# Patient Record
Sex: Male | Born: 1975 | Race: Black or African American | Hispanic: No | Marital: Single
Health system: Southern US, Community
[De-identification: ages and names within clinical notes are randomized; demographics above are authoritative.]

## PROBLEM LIST (undated history)

## (undated) ENCOUNTER — Encounter

---

## 2016-02-25 ENCOUNTER — Emergency Department: Admit: 2016-02-25 | Discharge: 2016-02-25

## 2016-02-25 ENCOUNTER — Inpatient Hospital Stay: Admit: 2016-02-25 | Discharge: 2016-02-25

## 2016-02-25 DIAGNOSIS — I10 Essential (primary) hypertension: Secondary | ICD-10-CM

## 2016-02-25 DIAGNOSIS — F17200 Nicotine dependence, unspecified, uncomplicated: Secondary | ICD-10-CM

## 2016-02-25 DIAGNOSIS — R202 Paresthesia of skin: Secondary | ICD-10-CM

## 2016-02-25 DIAGNOSIS — M542 Cervicalgia: Principal | ICD-10-CM

## 2016-02-25 DIAGNOSIS — Z21 Asymptomatic human immunodeficiency virus [HIV] infection status: Secondary | ICD-10-CM

## 2016-02-25 DIAGNOSIS — B20 Human immunodeficiency virus [HIV] disease: Principal | ICD-10-CM

## 2016-02-25 MED ORDER — FENTANYL CITRATE INJ 50 MCG/ML CUSTOM AMP/VIAL
50 ug | INTRAVENOUS | Status: DC | PRN
Start: 2016-02-25 — End: 2016-02-26

## 2016-02-25 MED ORDER — LIDOCAINE 5 % EX PTCH
1 | MEDICATED_PATCH | TRANSDERMAL | 0 refills | Status: CP
Start: 2016-02-25 — End: ?

## 2016-02-25 MED ORDER — FENTANYL CITRATE INJ 50 MCG/ML CUSTOM AMP/VIAL
50 ug | Freq: Once | INTRAVENOUS | Status: CP
Start: 2016-02-25 — End: ?

## 2016-02-25 MED ORDER — NAPROXEN 375 MG PO TABS
500 mg | Freq: Two times a day (BID) | ORAL | 0 refills | Status: CP
Start: 2016-02-25 — End: ?

## 2016-02-25 MED ORDER — AMLODIPINE BESYLATE 5 MG PO TABS
Freq: Every day | ORAL
Start: 2016-02-25 — End: ?

## 2016-02-25 MED ORDER — MIDAZOLAM HCL 2 MG/2ML IJ SOLN
2 mg | Freq: Once | INTRAVENOUS | Status: CP
Start: 2016-02-25 — End: ?

## 2016-02-25 MED ORDER — ACETAMINOPHEN 325 MG PO TABS
650 mg | Freq: Four times a day (QID) | ORAL | 0 refills | Status: CP | PRN
Start: 2016-02-25 — End: ?

## 2016-02-25 NOTE — ED Provider Notes
ED Clinical Impression:   No Clinical Impression Set      ED Patient Status   Patient Status:   {[phone]}        ED Medical Evaluation Initiated   Medical Evaluation Initiated:   Yes, filed at 02/25/16 [id number]  by Deirdre PippinsKumetz, Christopher, MD

## 2016-02-25 NOTE — ED Provider Notes
Yes, filed at 02/25/16 [id number]  by Deirdre PippinsKumetz, Christopher, MD        Charlestine NightAndrea Ochab, DO 3:45 PM 02/25/2016  LT, MC, USN    Family Medicine Residency
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
## 2016-02-25 NOTE — ED Provider Notes
endorse bilateral finger paresthesias    ED Disposition   ED Disposition: No ED Disposition Set      ED Clinical Impression   ED Clinical Impression:   No Clinical Impression Set      ED Patient Status   Patient Status:   Guarded        ED Medical Evaluation Initiated   Medical Evaluation Initiated:   Yes, filed at 02/25/16 [id number]  by Deirdre PippinsKumetz, Christopher, MD
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
## 2016-02-25 NOTE — ED Provider Notes
ED Patient Status   Patient Status:   Fair         ED Medical Evaluation Initiated   Medical Evaluation Initiated:   Yes, filed at 02/25/16 [id number]  by Deirdre PippinsKumetz, Christopher, MD        Charlestine NightAndrea Ochab, DO 3:45 PM 02/25/2016  LT, MC, USN    Family Medicine Residency      Charlestine Nightchab, Andrea, DO  Resident  02/25/16 [phone]
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
## 2016-02-25 NOTE — ED Provider Notes
Genitourinary: Negative for difficulty urinating.   Musculoskeletal: Positive for arthralgias (R sided rib pain). Negative for back pain. Myalgias: R sided rib pain    Skin: Negative for color change.   Neurological: Positive for numbness (numbness in bilateral 2nd digits). Negative for dizziness, tremors, seizures, syncope, facial asymmetry, speech difficulty, weakness, light-headedness and headaches.       Physical Exam       ED Triage Vitals   BP 02/25/16 0905 136/92   Pulse 02/25/16 0905 101   Resp 02/25/16 0905 16   Temp 02/25/16 0905 37.2 ?C (99 ?F)   Temp src 02/25/16 0905 Oral   Height 02/25/16 1021 1.651 m   Weight 02/25/16 1021 77.1 kg   SpO2 02/25/16 0905 99 %   BMI (Calculated) 02/25/16 1021 28.35             Physical Exam   Constitutional: He is oriented to person, place, and time. He appears well-developed and well-nourished. No distress.   HENT:   Head: Normocephalic and atraumatic.   Pt immobilized in c collar    Eyes: Pupils are equal, round, and reactive to light. Left eye exhibits no discharge.   Neck:   Neck immobilized in cspine collar    Cardiovascular: Normal rate, regular rhythm, normal heart sounds and intact distal pulses.    Pulmonary/Chest: Effort normal and breath sounds normal. No respiratory distress. He has no wheezes. He has no rales. He exhibits no tenderness.   Abdominal: Soft. Bowel sounds are normal. He exhibits no distension and no mass. There is no tenderness. There is no rebound and no guarding.   Musculoskeletal: Normal range of motion. He exhibits no edema, tenderness or deformity.   Neurological: He is alert and oriented to person, place, and time. Coordination abnormal.   Skin: Skin is warm and dry. No rash noted. He is not diaphoretic. No erythema.   Psychiatric: He has a normal mood and affect.   Nursing note and vitals reviewed.      Differential DDx: cspine fracture, PTX, clavicular fracture, ICH, PTX
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
## 2016-02-25 NOTE — Consults
Please see trauma H&P.
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
## 2016-02-25 NOTE — ED Provider Notes
Is this an Emergent Medical Condition? {SH ED EMERGENT MEDICAL CONDITION:[id number]}  409.901 FS  641.19 FS  627.732 (16) FS    ED Workup   Procedures    Labs:  - - No data to display      Imaging (Read by ED Provider):  {Imaging findings:[phone]}    EKG (Read by ED Provider):  {EKG findings:[phone]}    MDM    ED Course & Re-Evaluation     ED Course         ED Disposition   ED Disposition: No ED Disposition Set      ED Clinical Impression   ED Clinical Impression:   No Clinical Impression Set      ED Patient Status   Patient Status:   [facility]:[phone]}        ED Medical Evaluation Initiated   Medical Evaluation Initiated:   Yes, filed at 02/25/16 [id number]  by Deirdre PippinsKumetz, Christopher, MD
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
## 2016-02-25 NOTE — ED Provider Notes
Is this an Emergent Medical Condition? Yes - Severe Pain/Acute Onset of Symptons  409.901 FS  641.19 FS  627.732 (16) FS    ED Workup   Procedures    Labs:  - - No data to display      Imaging (Read by ED Provider):  {Imaging findings:[phone]}    Per Radiology: Xr Chest Single View    Result Date: 02/25/2016  Procedure:  XR CHEST SINGLE VIEW Ordering History:  40 years  Male  Chest trauma Comparison: None Findings: There is normal appearance of the cardiomediastinal silhouette. No focal consolidations or pleural effusions are identified. Examination is negative for pneumothorax or acute osseous abnormalities.     Impression: No evidence of acute cardiopulmonary disease.      I personally reviewed the images and the residents findings and agree with the above. Read By Vic Ripper- Joseph Utz M.D.  Electronically Verified By - Vic RipperJoseph Utz M.D.  Released Date Time - 02/25/2016 9:31 AM  Resident - Barrie LymeFranz Toro-Pape     Per Radiology: Xr Pelvis 1 Or 2 Views    Result Date: 02/25/2016  A single AP view of the pelvis was obtained without prior studies for comparison.  No fractures  or dislocations are seen.  No radiopaque foreign bodies are seen.  The visualized soft tissues  are unremarkable.      Impression: No abnormality of the pelvis. Read By Florencia Reasons- Kristin Taylor M.D.  Electronically Verified By - Florencia ReasonsKristin Taylor M.D.  Released Date Time - 02/25/2016 9:36 AM  Resident -     EKG (Read by ED Provider):  {EKG findings:[phone]}  N/A    MDM  Number of Diagnoses or Management Options     Amount and/or Complexity of Data Reviewed  Clinical lab tests: ordered  Tests in the radiology section of CPT?: ordered  Tests in the medicine section of CPT?: ordered  Discussion of test results with the performing providers: yes  Decide to obtain previous medical records or to obtain history from someone other than the patient: no  Obtain history from someone other than the patient: no  Review and summarize past medical records: yes

## 2016-02-25 NOTE — ED Notes
Patient to MRI.

## 2016-02-25 NOTE — ED Notes
Patient reports an improvement in tingling sensation to bilateral hands.
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
## 2016-02-25 NOTE — ED Provider Notes
endorse bilateral finger paresthesias so MRI cspine ordered.     MRI ordered by trauma. Pain controlled. Awaiting reads.        ED Disposition   ED Disposition: No ED Disposition Set      ED Clinical Impression   ED Clinical Impression:   No Clinical Impression Set      ED Patient Status   Patient Status:   Guarded        ED Medical Evaluation Initiated   Medical Evaluation Initiated:   Yes, filed at 02/25/16 [id number]  by Deirdre PippinsKumetz, Christopher, MD
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
## 2016-02-25 NOTE — ED Provider Notes
unremarkable. Craniocervical junction is congruent. C1 dens interval is maintained. The visualized spinal cord has a normal conformation and appropriate internal signal. 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 No focal signal abnormality  is evident to suggest myelomalacia, syrinx or intra axial mass. Soft tissues the neck are grossly unremarkable. C2-3:Unremarkable. C3-4: Posterior disc osteophyte complex causes moderate narrowing of the left neural foramina mild narrowing of the right neural foramina and contributing to mild narrowing of the spinal canal. C4-5: Disc bulge and uncovertebral hypertrophy contribute to mild narrowing of the right neural  foramina. Left neural foramina is patent. C5-6: Broad-based is bulge causes mild narrowing of the bilateral foramina and mild narrowing of the spinal canal.   C6-7: Asymmetric right disc bulge causes mild narrowing of the right neural foramina left neural foramina and spinal canal are patent. C7-T1: Unremarkable.     1.  Mild multilevel spondylosis 2. 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 3. No abnormal cord signal or evidence of acute cord injury.     Per Radiology: Xr Chest Single View    Result Date: 02/25/2016  Procedure:  XR CHEST SINGLE VIEW Ordering History:  40 years  Male  Chest trauma Comparison: None Findings: There is normal appearance of the cardiomediastinal silhouette. No focal consolidations or pleural effusions are identified. Examination is negative for pneumothorax or acute osseous abnormalities.     Impression: No evidence of acute cardiopulmonary disease.      I personally reviewed the images and the residents findings and agree with the above. Read By Vic Ripper- Joseph Utz M.D.  Electronically Verified By - Vic RipperJoseph Utz M.D.  Released Date Time - 02/25/2016 9:31 AM  Resident - Barrie LymeFranz Toro-Pape     Per Radiology: Xr Pelvis 1 Or 2 Views    Result Date: 02/25/2016

## 2016-02-25 NOTE — ED Provider Notes
HENT: Negative for facial swelling, trouble swallowing, neck pain and neck stiffness.    Gastrointestinal: Negative for nausea and vomiting.   Genitourinary: Negative for difficulty urinating.   Musculoskeletal: Positive for arthralgias (R sided rib pain). Negative for back pain. Myalgias: R sided rib pain    Skin: Negative for color change.   Neurological: Positive for numbness (numbness in bilateral 2nd digits). Negative for dizziness, tremors, seizures, syncope, facial asymmetry, speech difficulty, weakness, light-headedness and headaches.       Physical Exam       ED Triage Vitals   BP 02/25/16 0905 136/92   Pulse 02/25/16 0905 101   Resp 02/25/16 0905 16   Temp 02/25/16 0905 37.2 ?C (99 ?F)   Temp src 02/25/16 0905 Oral   Height 02/25/16 1021 1.651 m   Weight 02/25/16 1021 77.1 kg   SpO2 02/25/16 0905 99 %   BMI (Calculated) 02/25/16 1021 28.35             Physical Exam   Constitutional: He is oriented to person, place, and time. He appears well-developed and well-nourished. No distress.   HENT:   Head: Normocephalic and atraumatic.   Pt immobilized in c collar    Eyes: Pupils are equal, round, and reactive to light. Left eye exhibits no discharge.   Neck:   Neck immobilized in cspine collar    Cardiovascular: Normal rate, regular rhythm, normal heart sounds and intact distal pulses.    Pulmonary/Chest: Effort normal and breath sounds normal. No respiratory distress. He has no wheezes. He has no rales. He exhibits no tenderness.   Abdominal: Soft. Bowel sounds are normal. He exhibits no distension and no mass. There is no tenderness. There is no rebound and no guarding.   Musculoskeletal: Normal range of motion. He exhibits no edema, tenderness or deformity.   Neurological: He is alert and oriented to person, place, and time. Coordination abnormal.   Skin: Skin is warm and dry. No rash noted. He is not diaphoretic. No erythema.   Psychiatric: He has a normal mood and affect.   Nursing note and vitals reviewed.
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
## 2016-02-25 NOTE — ED Provider Notes
History   No chief complaint on file.      HPI    No Known Allergies    Patient's Medications    No medications on file       Past Medical History:   Diagnosis Date   ? HIV (human immunodeficiency virus infection)    ? Hypertension        Past Surgical History:   Procedure Laterality Date   ? INCISION & DRAINAGE PILONIDAL CYST / SINUS         History reviewed. No pertinent family history.    Social History     Social History   ? Marital status: N/A     Spouse name: N/A   ? Number of children: N/A   ? Years of education: N/A     Social History Main Topics   ? Smoking status: Current Every Day Smoker   ? Smokeless tobacco: Never Used   ? Alcohol use No   ? Drug use: No   ? Sexual activity: Not Asked     Other Topics Concern   ? None     Social History Narrative   ? None       Review of Systems    Physical Exam     ED Triage Vitals   BP --    Pulse --    Resp --    Temp --    Temp src --    Height --    Weight --    SpO2 --    BMI (Calculated) --              Physical Exam    Differential DDx: ***    Is this an Emergent Medical Condition? {SH ED EMERGENT MEDICAL [phone]}  409.901 FS  641.19 FS  627.732 (16) FS    ED Workup   Procedures    Labs:  - - No data to display      Imaging (Read by ED Provider):  {Imaging findings:[id number]}    EKG (Read by ED Provider):  {EKG findings:[phone]}    MDM    ED Course & Re-Evaluation     ED Course         ED Disposition   ED Disposition: No ED Disposition Set      ED Clinical Impression   ED Clinical Impression:   No Clinical Impression Set      ED Patient Status   Patient Status:   [phone]}        ED Medical Evaluation Initiated   Medical Evaluation Initiated:   Yes, filed at 02/25/16 [id number]  by Deirdre PippinsKumetz, Christopher, MD

## 2016-02-25 NOTE — ED Provider Notes
is no evidence of epidural collection. Posterior fossa structures are unremarkable. Craniocervical junction is congruent. C1 dens interval is maintained. The visualized spinal cord has a normal conformation and appropriate internal signal. 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 No focal signal abnormality  is evident to suggest myelomalacia, syrinx or intra axial mass. Soft tissues the neck are grossly unremarkable. C2-3:Unremarkable. C3-4: Posterior disc osteophyte complex causes moderate narrowing of the left neural foramina mild narrowing of the right neural foramina and contributing to mild narrowing of the spinal canal. C4-5: Disc bulge and uncovertebral hypertrophy contribute to mild narrowing of the right neural  foramina. Left neural foramina is patent. C5-6: Broad-based is bulge causes mild narrowing of the bilateral foramina and mild narrowing of the spinal canal.   C6-7: Asymmetric right disc bulge causes mild narrowing of the right neural foramina left neural foramina and spinal canal are patent. C7-T1: Unremarkable.     1.  Mild multilevel spondylosis 2. 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 3. No abnormal cord signal or evidence of acute cord injury.     Per Radiology: Xr Chest Single View    Result Date: 02/25/2016  Procedure:  XR CHEST SINGLE VIEW Ordering History:  40 years  Male  Chest trauma Comparison: None Findings: There is normal appearance of the cardiomediastinal silhouette. No focal consolidations or pleural effusions are identified. Examination is negative for pneumothorax or acute osseous abnormalities.     Impression: No evidence of acute cardiopulmonary disease.      I personally reviewed the images and the residents findings and agree with the above. Read By Vic Ripper- Joseph Utz M.D.  Electronically Verified By - Vic RipperJoseph Utz M.D.  Released Date Time - 02/25/2016 9:31 AM  Resident - Barrie LymeFranz Toro-Pape
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
## 2016-02-25 NOTE — Progress Notes
I met pt's brother, Ann Maki [phone] and his cousin and escorted them to [facility].  I made staff aware they are here.

## 2016-02-25 NOTE — ED Notes
Patient to CT scan.
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
## 2016-02-25 NOTE — ED Notes
Patient returned to trauma bed 5 from CT scan. Patient complains of neck pain. Patient reports a return to normal sensation to bilateral hands. Family at bedside. Continue to monitor.
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
## 2016-02-25 NOTE — ED Provider Notes
Discuss the patient with other providers: yes  Independent visualization of images, tracings, or specimens: yes    Risk of Complications, Morbidity, and/or Mortality  Presenting problems: high  Diagnostic procedures: high  Management options: high        ED Course & Re-Evaluation     ED Course         ED Disposition   ED Disposition: No ED Disposition Set      ED Clinical Impression   ED Clinical Impression:   No Clinical Impression Set      ED Patient Status   Patient Status:   Guarded        ED Medical Evaluation Initiated   Medical Evaluation Initiated:   Yes, filed at 02/25/16 [id number]  by Deirdre PippinsKumetz, Christopher, MD
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
## 2016-02-25 NOTE — ED Notes
Up OOB ambulatory to trauma waiting room.  Urinated without difficulty per report.  Tolerating PO intake without any difficult.  Discharge pending.
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
## 2019-01-31 ENCOUNTER — Emergency Department (HOSPITAL_COMMUNITY): Payer: Worker's Compensation

## 2019-01-31 ENCOUNTER — Other Ambulatory Visit: Payer: Self-pay

## 2019-01-31 ENCOUNTER — Encounter (HOSPITAL_COMMUNITY): Payer: Self-pay

## 2019-01-31 ENCOUNTER — Emergency Department (HOSPITAL_COMMUNITY)
Admission: EM | Admit: 2019-01-31 | Discharge: 2019-01-31 | Disposition: A | Discharge status: Home / Self Care | Payer: Worker's Compensation | Attending: Emergency Medicine | Admitting: Emergency Medicine

## 2019-01-31 DIAGNOSIS — Y9389 Activity, other specified: Secondary | ICD-10-CM | POA: Diagnosis not present

## 2019-01-31 DIAGNOSIS — S29019A Strain of muscle and tendon of unspecified wall of thorax, initial encounter: Secondary | ICD-10-CM

## 2019-01-31 DIAGNOSIS — Y990 Civilian activity done for income or pay: Secondary | ICD-10-CM | POA: Insufficient documentation

## 2019-01-31 DIAGNOSIS — S20211A Contusion of right front wall of thorax, initial encounter: Secondary | ICD-10-CM | POA: Diagnosis not present

## 2019-01-31 DIAGNOSIS — W1789XA Other fall from one level to another, initial encounter: Secondary | ICD-10-CM

## 2019-01-31 DIAGNOSIS — S299XXA Unspecified injury of thorax, initial encounter: Secondary | ICD-10-CM | POA: Diagnosis present

## 2019-01-31 DIAGNOSIS — Y92812 Truck as the place of occurrence of the external cause: Secondary | ICD-10-CM | POA: Insufficient documentation

## 2019-01-31 DIAGNOSIS — S39012A Strain of muscle, fascia and tendon of lower back, initial encounter: Secondary | ICD-10-CM | POA: Diagnosis not present

## 2019-01-31 DIAGNOSIS — S8002XA Contusion of left knee, initial encounter: Secondary | ICD-10-CM | POA: Diagnosis not present

## 2019-01-31 LAB — RAPID URINE DRUG SCREEN, HOSP PERFORMED
Amphetamines: NOT DETECTED
Barbiturates: NOT DETECTED
Benzodiazepines: NOT DETECTED
Cocaine: NOT DETECTED
Opiates: NOT DETECTED
Tetrahydrocannabinol: NOT DETECTED

## 2019-01-31 MED ORDER — METHOCARBAMOL 750 MG PO TABS: 750.0000 mg | ORAL_TABLET | Freq: Three times a day (TID) | ORAL | 0 refills | Status: AC | PRN

## 2019-01-31 MED ORDER — TRAMADOL HCL 50 MG PO TABS
50.0000 mg | ORAL_TABLET | Freq: Once | ORAL | Status: AC
  Administered 2019-01-31: 08:00:00 50 mg via ORAL
  Filled 2019-01-31: qty 1

## 2019-01-31 NOTE — Discharge Instructions (Addendum)
It was our pleasure to provide your ER care today - we hope that you feel better.  Your xrays were read as showing no fracture/no broken bones.   As we discussed, your left knee pain could be related to ligamentous/soft tissue injury or sprain. Follow up with orthopedist in the next 1-2 weeks - call office to arrange appointment. Wear knee immobilizer/use crutches.   Take ibuprofen or naprosyn as need.  Take robaxin as need for muscle pain/spasm  - no driving when taking.   Return to ER if worse, new symptoms, new or severe pain, severe headache, abdominal pain, numbness/weakness, or other concern.   You were given pain medication in the ER - no driving for the next 6 hours.

## 2019-01-31 NOTE — ED Triage Notes (Signed)
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 Pt denies hitting his head, however, he cannot remember how he landed, also states that he landed on concrete. Pain is in his Lt rib area, lower back & a sharp pain he describes as a "tearing" pain under his Lt knee. Upon arrival to ED pt is A/Ox4, c-collar in place, BP 159/103 & all other VSS.
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
## 2019-01-31 NOTE — Progress Notes (Signed)
Orthopedic Tech Progress Note Patient Details:  Brent Barnes 1975/10/12 [id number]  Ortho Devices Type of Ortho Device: Knee Immobilizer, Crutches Ortho Device/Splint Location: left Ortho Device/Splint Interventions: Application   Post Interventions Patient Tolerated: Well Instructions Provided: Care of device   [name] 01/31/2019, 11:41 AM

## 2019-01-31 NOTE — ED Notes (Signed)
Attempted to assist pt to ambulate and pt stated he could not walk.

## 2019-01-31 NOTE — ED Notes (Signed)
Ortho has been contacted  Is coming to apply immobilizer & bring the correct size crutches that was not found on the unit.

## 2019-01-31 NOTE — ED Notes (Signed)
Pt verbalized understanding of discharge paperwork, prescription and follow-up care.  

## 2020-03-03 IMAGING — DX DG LUMBAR SPINE COMPLETE 4+V
5 series · 5 of 5 positions shown · non-contrast
Comparison: None.

CLINICAL DATA: Pain following fall

EXAM:
LUMBAR SPINE - COMPLETE 4+ VIEW

[t lumbar spine obl (1 of 2)]
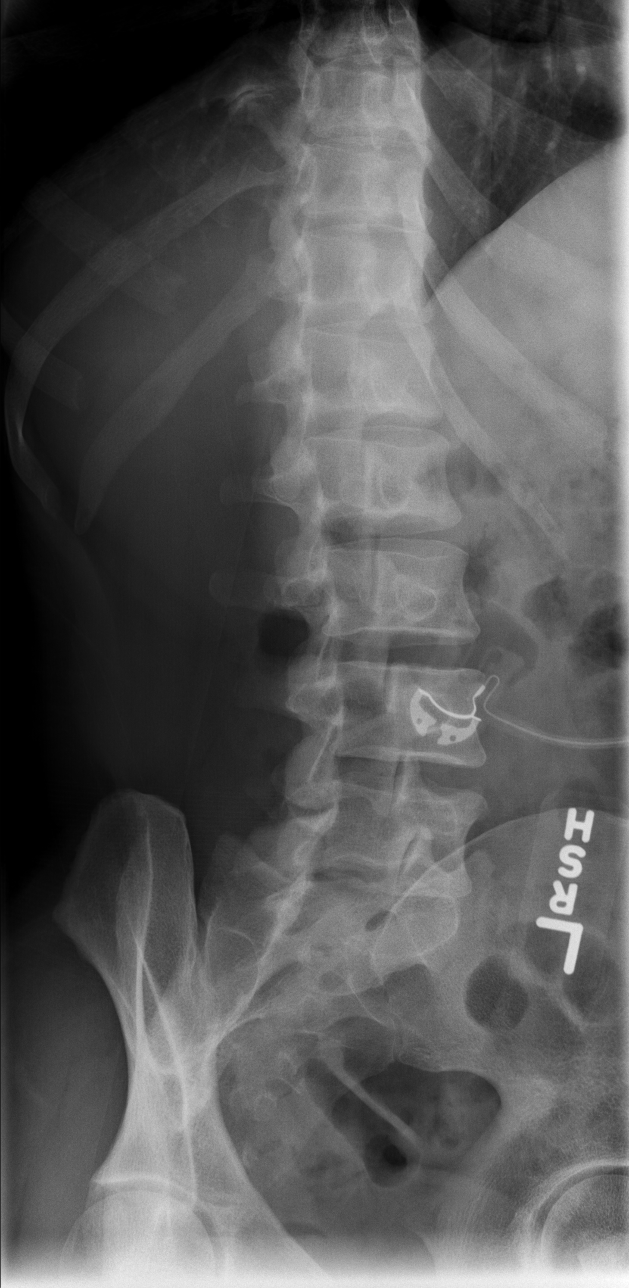

[t lumbar spine lat]
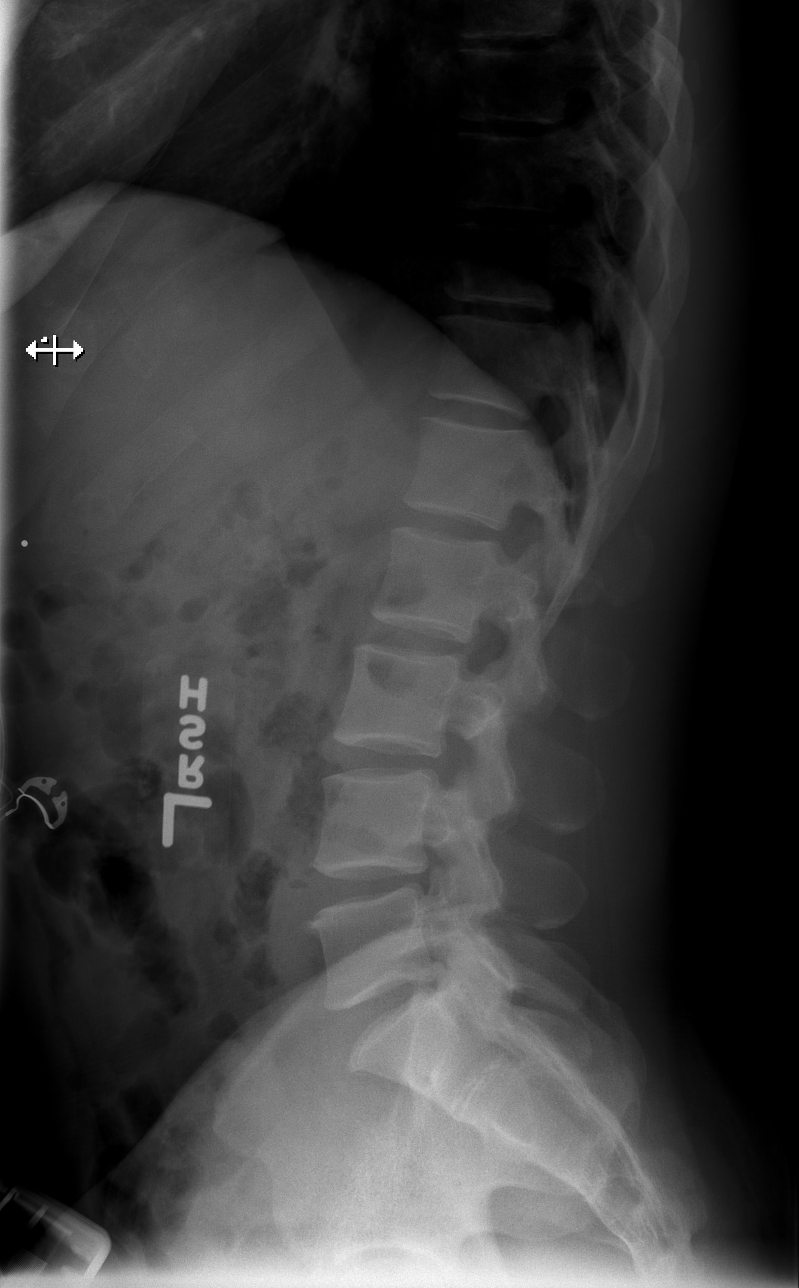

[t lumbar l-5 s-1 spot]
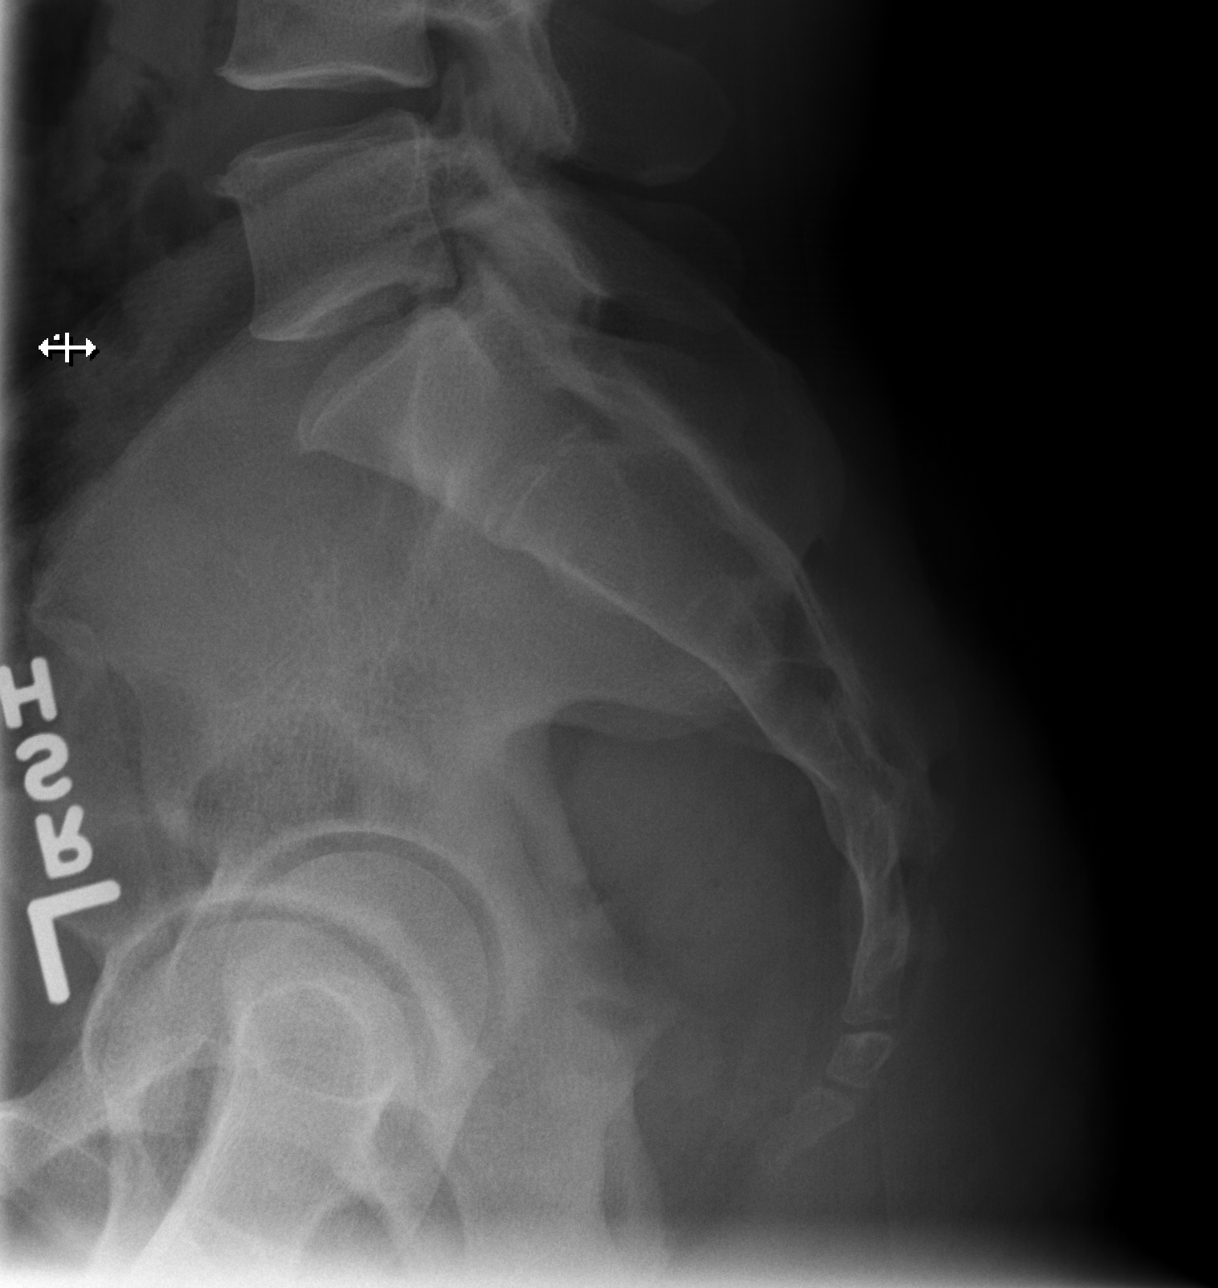

[t lumbar spine ap]
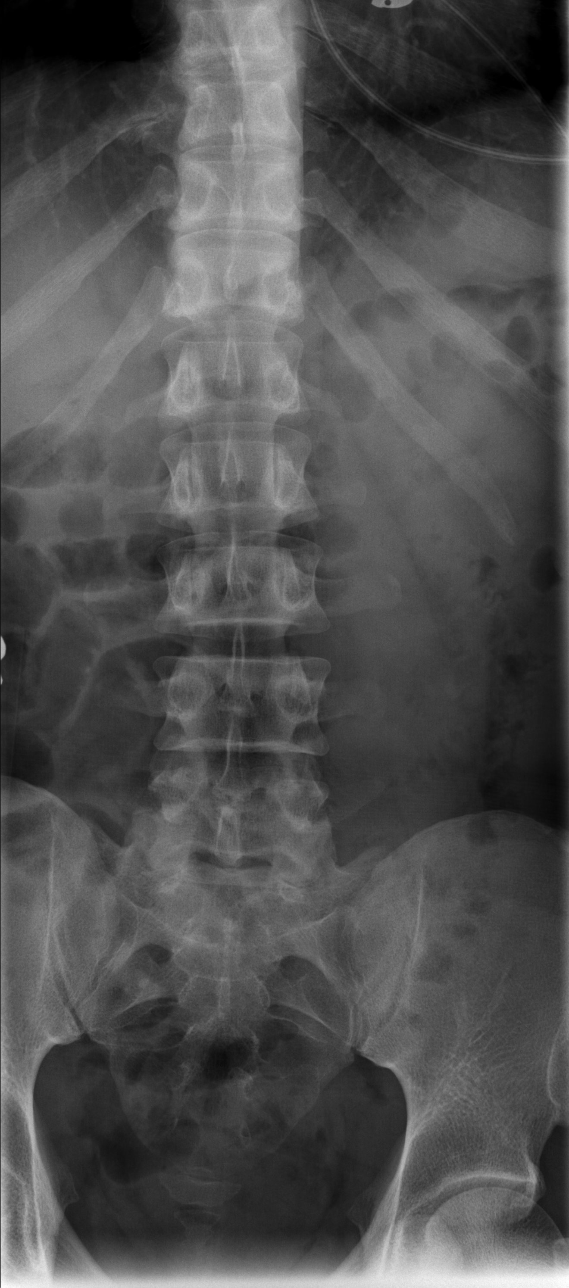

[t lumbar spine obl (2 of 2)]
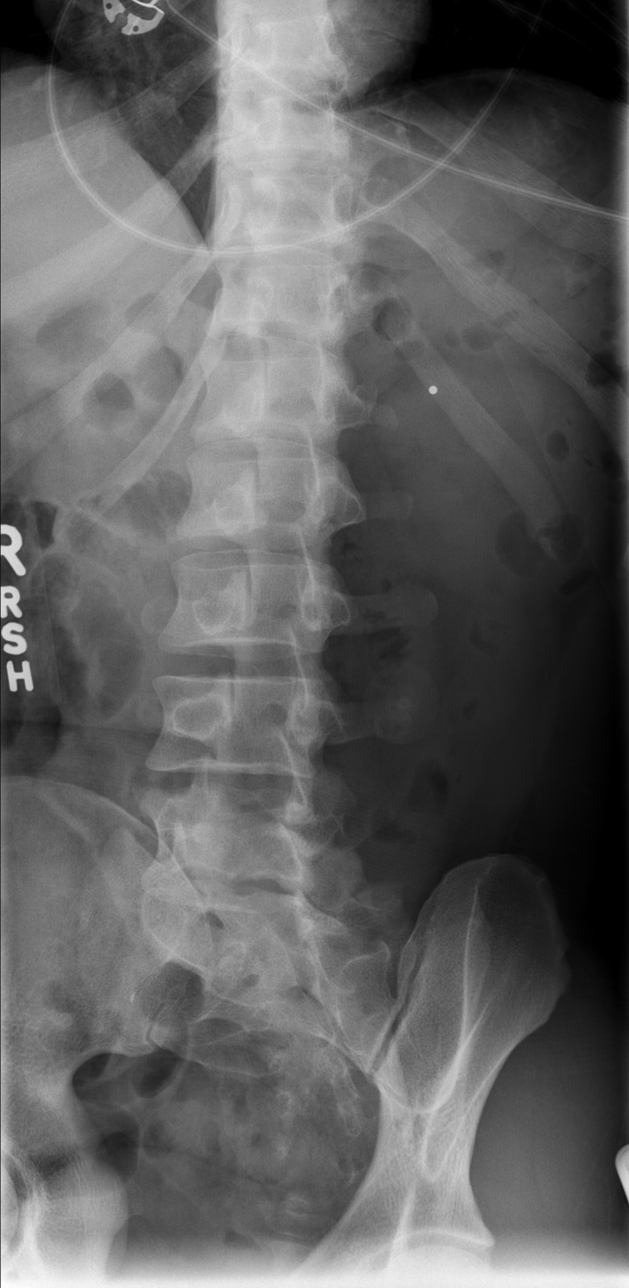

[5 of 5 positions shown; findings below may reference images not displayed]

FINDINGS: Frontal, lateral, spot lumbosacral lateral, and bilateral oblique
views were obtained. There are 5 non-rib-bearing lumbar type
vertebral bodies. There is mid lumbar levoscoliosis. There is no
evident fracture or spondylolisthesis. Disc spaces appear
unremarkable. There is no appreciable facet arthropathy.
IMPRESSION: Slight levoscoliosis. No fracture or spondylolisthesis. No
appreciable arthropathy.
